# Patient Record
Sex: Male | Born: 1990 | Race: Black or African American | Hispanic: No | Marital: Single | Smoking: Current every day smoker
Health system: Southern US, Community
[De-identification: ages and names within clinical notes are randomized; demographics above are authoritative.]

---

## 2016-07-01 ENCOUNTER — Emergency Department: Payer: No Typology Code available for payment source

## 2016-07-01 ENCOUNTER — Encounter: Payer: Self-pay | Admitting: Emergency Medicine

## 2016-07-01 ENCOUNTER — Emergency Department
Admission: EM | Admit: 2016-07-01 | Discharge: 2016-07-01 | Disposition: A | Discharge status: Home / Self Care | Payer: No Typology Code available for payment source | Attending: Emergency Medicine | Admitting: Emergency Medicine

## 2016-07-01 DIAGNOSIS — S43005A Unspecified dislocation of left shoulder joint, initial encounter: Secondary | ICD-10-CM | POA: Diagnosis not present

## 2016-07-01 DIAGNOSIS — Y939 Activity, unspecified: Secondary | ICD-10-CM | POA: Insufficient documentation

## 2016-07-01 DIAGNOSIS — Y999 Unspecified external cause status: Secondary | ICD-10-CM | POA: Insufficient documentation

## 2016-07-01 DIAGNOSIS — Y92410 Unspecified street and highway as the place of occurrence of the external cause: Secondary | ICD-10-CM | POA: Insufficient documentation

## 2016-07-01 DIAGNOSIS — F17210 Nicotine dependence, cigarettes, uncomplicated: Secondary | ICD-10-CM | POA: Diagnosis not present

## 2016-07-01 DIAGNOSIS — M25512 Pain in left shoulder: Secondary | ICD-10-CM | POA: Diagnosis present

## 2016-07-01 MED ORDER — TRAMADOL HCL 50 MG PO TABS: 50.0000 mg | ORAL_TABLET | Freq: Four times a day (QID) | ORAL | Status: AC | PRN
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
## 2016-07-01 NOTE — ED Notes (Signed)
X-ray at bedside

## 2016-07-01 NOTE — Discharge Instructions (Signed)
Please seek medical attention for any high fevers, chest pain, shortness of breath, change in behavior, persistent vomiting, bloody stool or any other new or concerning symptoms.   Shoulder Dislocation Your shoulder joint is made up of 3 bones:  The upper arm bone (humerus).  The shoulder blade (scapula).  The collarbone (clavicle). A shoulder dislocation happens when your upper arm bone moves out of its normal place in your shoulder joint. HOME CARE If You Have a Splint or Sling:  Wear it as told by your doctor.  Take it off only as told by your doctor.  Loosen it if:  Your fingers become numb and tingly.  Your fingers turn cold and blue.  Keep it clean and dry. Bathing  Do not take baths, swim, or use a hot tub until your doctor says you can. Ask your doctor if you can take showers. You may only be allowed to take sponge baths.  If your doctor says taking baths or showers is okay, cover your splint or sling with a plastic bag. Do not let the splint or sling get wet. Managing Pain, Stiffness, and Swelling  If told, put ice on the injured area.  Put ice in a plastic bag.  Place a towel between your skin and the bag.  Leave the ice on for 20 minutes, 2-3 times per day.  Move your fingers often to avoid stiffness and to lessen swelling.  Raise (elevate) the injured area above the level of your heart while you are sitting or lying down. Driving  Do not drive while you are wearing a splint or sling on a hand that you use for driving.  Do not drive or operate heavy machinery while taking pain medicine. Activity  Return to your normal activities as told by your doctor. Ask your doctor what activities are safe for you.  Do range-of-motion exercises only as told by your doctor.  Exercise your hand by squeezing a soft ball. This keeps your hand and wrist from getting stiff and swollen. General Instructions  Take over-the-counter and prescription medicines only as told  by your doctor.  Do not use any tobacco products, including cigarettes, chewing tobacco, or e-cigarettes. Tobacco can slow down healing. If you need help quitting, ask your doctor.  Keep all follow-up visits as told by your doctor. This is important. GET HELP IF:  Your splint or sling gets damaged. GET HELP RIGHT AWAY IF:  Your pain gets worse instead of better.  You lose feeling in your arm or hand.  Your arm or hand turns white and cold.   This information is not intended to replace advice given to you by your health care provider. Make sure you discuss any questions you have with your health care provider.   Document Released: 02/19/2012 Document Revised: 08/18/2015 Document Reviewed: 03/22/2015 Elsevier Interactive Patient Education [employer]
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
